# Patient Record
Sex: Male | Born: 1995 | Race: Black or African American | Hispanic: No | Marital: Single | Smoking: Never smoker
Health system: Southern US, Community
[De-identification: ages and names within clinical notes are randomized; demographics above are authoritative.]

---
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
## 2013-01-28 ENCOUNTER — Encounter (HOSPITAL_COMMUNITY): Payer: Self-pay | Admitting: Emergency Medicine

## 2013-01-28 ENCOUNTER — Emergency Department (HOSPITAL_COMMUNITY)
Admission: EM | Admit: 2013-01-28 | Discharge: 2013-01-28 | Disposition: A | Discharge status: Home / Self Care | Payer: Medicaid Other | Attending: Emergency Medicine | Admitting: Emergency Medicine

## 2013-01-28 ENCOUNTER — Emergency Department (HOSPITAL_COMMUNITY): Payer: Medicaid Other

## 2013-01-28 DIAGNOSIS — T07XXXA Unspecified multiple injuries, initial encounter: Secondary | ICD-10-CM | POA: Insufficient documentation

## 2013-01-28 DIAGNOSIS — S0100XA Unspecified open wound of scalp, initial encounter: Secondary | ICD-10-CM | POA: Insufficient documentation

## 2013-01-28 DIAGNOSIS — S0990XA Unspecified injury of head, initial encounter: Secondary | ICD-10-CM | POA: Insufficient documentation

## 2013-01-28 DIAGNOSIS — S0101XA Laceration without foreign body of scalp, initial encounter: Secondary | ICD-10-CM

## 2013-01-28 DIAGNOSIS — Z23 Encounter for immunization: Secondary | ICD-10-CM | POA: Insufficient documentation

## 2013-01-28 MED ORDER — IBUPROFEN 800 MG PO TABS: 800.0000 mg | ORAL_TABLET | Freq: Once | ORAL | Status: DC

## 2013-01-28 MED ORDER — HYDROCODONE-ACETAMINOPHEN 5-325 MG PO TABS: 2.0000 | ORAL_TABLET | ORAL | Status: DC | PRN

## 2013-01-28 MED ORDER — IBUPROFEN 800 MG PO TABS
800.0000 mg | ORAL_TABLET | Freq: Once | ORAL | Status: AC
  Administered 2013-01-28: 800 mg via ORAL
  Filled 2013-01-28: qty 1

## 2013-01-28 MED ORDER — TETANUS-DIPHTH-ACELL PERTUSSIS 5-2.5-18.5 LF-MCG/0.5 IM SUSP
0.5000 mL | Freq: Once | INTRAMUSCULAR | Status: AC
  Administered 2013-01-28: 0.5 mL via INTRAMUSCULAR
  Filled 2013-01-28: qty 0.5
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
## 2013-01-28 NOTE — ED Notes (Signed)
Patient found to have Laceration to posterior scalp, bleeding controlled at this time.

## 2013-01-28 NOTE — ED Notes (Signed)
Placed one ice pack on the posterior crest of patients head and one ice pack on his right side face/forehead.

## 2013-01-28 NOTE — ED Notes (Addendum)
MD at bedside. 

## 2013-01-31 NOTE — ED Provider Notes (Signed)
Medical screening examination/treatment/procedure(s) were performed by non-physician practitioner and as supervising physician I was immediately available for consultation/collaboration.     Brandt Loosen, MD 01/31/13 [phone]

## 2013-02-18 NOTE — ED Provider Notes (Signed)
Medical screening examination/treatment/procedure(s) were performed by non-physician practitioner and as supervising physician I was immediately available for consultation/collaboration.    Brandt Loosen, MD 02/18/13 [phone]

## 2013-12-03 ENCOUNTER — Emergency Department (HOSPITAL_COMMUNITY)
Admission: EM | Admit: 2013-12-03 | Discharge: 2013-12-03 | Disposition: A | Discharge status: Home / Self Care | Payer: BC Managed Care – PPO | Attending: Emergency Medicine | Admitting: Emergency Medicine

## 2013-12-03 ENCOUNTER — Encounter (HOSPITAL_COMMUNITY): Payer: Self-pay | Admitting: Emergency Medicine

## 2013-12-03 ENCOUNTER — Emergency Department (HOSPITAL_COMMUNITY): Payer: BC Managed Care – PPO

## 2013-12-03 DIAGNOSIS — S060X0A Concussion without loss of consciousness, initial encounter: Secondary | ICD-10-CM | POA: Diagnosis not present

## 2013-12-03 DIAGNOSIS — S0990XA Unspecified injury of head, initial encounter: Secondary | ICD-10-CM | POA: Insufficient documentation

## 2013-12-03 DIAGNOSIS — Y92410 Unspecified street and highway as the place of occurrence of the external cause: Secondary | ICD-10-CM | POA: Insufficient documentation

## 2013-12-03 DIAGNOSIS — T1490XA Injury, unspecified, initial encounter: Secondary | ICD-10-CM

## 2013-12-03 DIAGNOSIS — Y9389 Activity, other specified: Secondary | ICD-10-CM | POA: Insufficient documentation

## 2013-12-03 MED ORDER — IBUPROFEN 600 MG PO TABS: 600.0000 mg | ORAL_TABLET | Freq: Four times a day (QID) | ORAL | Status: DC | PRN

## 2013-12-03 MED ORDER — HYDROCODONE-ACETAMINOPHEN 5-325 MG PO TABS: 1.0000 | ORAL_TABLET | Freq: Four times a day (QID) | ORAL | Status: DC | PRN

## 2013-12-03 NOTE — ED Notes (Signed)
Pt discharged home with all belongings, pt alert, oriented and ambulatory upon discharge. 2 new RX prescribed, pt verbalizes understanding of discharge instructions, pt driven home by mother. Pt escorted to exit via wheel chair by [employer]
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
## 2013-12-03 NOTE — ED Notes (Signed)
Patient transported to CT 

## 2013-12-03 NOTE — ED Notes (Signed)
MD [place] at bedside.
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
## 2017-04-17 ENCOUNTER — Other Ambulatory Visit: Payer: Self-pay

## 2017-04-17 ENCOUNTER — Encounter (HOSPITAL_COMMUNITY): Payer: Self-pay | Admitting: *Deleted

## 2017-04-17 ENCOUNTER — Emergency Department (HOSPITAL_COMMUNITY)
Admission: EM | Admit: 2017-04-17 | Discharge: 2017-04-17 | Disposition: A | Discharge status: Home / Self Care | Payer: Self-pay | Attending: Emergency Medicine | Admitting: Emergency Medicine

## 2017-04-17 DIAGNOSIS — L299 Pruritus, unspecified: Secondary | ICD-10-CM | POA: Insufficient documentation

## 2017-04-17 LAB — URINALYSIS, ROUTINE W REFLEX MICROSCOPIC
Bilirubin Urine: NEGATIVE
GLUCOSE, UA: NEGATIVE mg/dL
Hgb urine dipstick: NEGATIVE
Ketones, ur: NEGATIVE mg/dL
LEUKOCYTES UA: NEGATIVE
Nitrite: NEGATIVE
PH: 7 (ref 5.0–8.0)
PROTEIN: NEGATIVE mg/dL
Specific Gravity, Urine: 1.02 (ref 1.005–1.030)

## 2017-04-17 LAB — CBC WITH DIFFERENTIAL/PLATELET
Basophils Absolute: 0 10*3/uL (ref 0.0–0.1)
Basophils Relative: 0 %
EOS ABS: 0.1 10*3/uL (ref 0.0–0.7)
EOS PCT: 1 %
HCT: 43.2 % (ref 39.0–52.0)
Hemoglobin: 14.4 g/dL (ref 13.0–17.0)
Lymphocytes Relative: 25 %
Lymphs Abs: 1.7 10*3/uL (ref 0.7–4.0)
MCH: 31 pg (ref 26.0–34.0)
MCHC: 33.3 g/dL (ref 30.0–36.0)
MCV: 93.1 fL (ref 78.0–100.0)
MONO ABS: 0.3 10*3/uL (ref 0.1–1.0)
MONOS PCT: 5 %
Neutro Abs: 4.7 10*3/uL (ref 1.7–7.7)
Neutrophils Relative %: 69 %
PLATELETS: 361 10*3/uL (ref 150–400)
RBC: 4.64 MIL/uL (ref 4.22–5.81)
RDW: 11.9 % (ref 11.5–15.5)
WBC: 6.7 10*3/uL (ref 4.0–10.5)

## 2017-04-17 LAB — COMPREHENSIVE METABOLIC PANEL
ALT: 19 U/L (ref 17–63)
AST: 28 U/L (ref 15–41)
Albumin: 4.5 g/dL (ref 3.5–5.0)
Alkaline Phosphatase: 72 U/L (ref 38–126)
Anion gap: 6 (ref 5–15)
BUN: 11 mg/dL (ref 6–20)
CO2: 27 mmol/L (ref 22–32)
CREATININE: 0.99 mg/dL (ref 0.61–1.24)
Calcium: 9 mg/dL (ref 8.9–10.3)
Chloride: 104 mmol/L (ref 101–111)
GFR calc Af Amer: >60 mL/min (ref >60)
GLUCOSE: 93 mg/dL (ref 65–99)
Potassium: 3.9 mmol/L (ref 3.5–5.1)
Sodium: 137 mmol/L (ref 135–145)
Total Bilirubin: 0.6 mg/dL (ref 0.3–1.2)
Total Protein: 7.8 g/dL (ref 6.5–8.1)

## 2017-04-17 NOTE — ED Notes (Signed)
Bed: WTR8 Expected date:  Expected time:  Means of arrival:  Comments: 

## 2017-04-17 NOTE — ED Triage Notes (Addendum)
Pt has had dry itchy skin without rash for about a month, just tired of it today and decided to come be checked.MOTHER IS WITH PT AND IS STATING THAT PT HAS WHEEZING AND BREATHING HARD, PT DENIES IT OR ANY SYMPTOMS ASSOCIATED WITH IT. MOTHER WANTS "INTERNAL TEST" DONE TO FINE OUT WHY HE IS ITCHING. MOTHER WALKS OUT OF ROOM STATING "HOSPITALS ARE GETTING SORRIER AND SORRIER."

## 2017-04-17 NOTE — Discharge Instructions (Signed)
There were no abnormalities noted on the lab work.  This includes no abnormalities in blood sugar levels, kidney function, liver function, or blood counts. Itching on the skin is commonly from a product, something you are eating, or something to which you have been repeated exposed, even if you have not had issues with that substance before.  One of the best ways to find what it could be is to keep a journal of everything you put on your skin or put in your body, and then take away one thing at a time for a week or two. This will also help a specialist should you need to see one.   Treatment with hydrocortisone cream or ointment may help.  You may also simply try moisturizing the skin with thicker products, such as Eucerin lotion or moisturizing ointments.  Please follow-up with a primary care provider, dermatologist, or allergy specialist on this matter.   Follow up with a

## 2017-04-17 NOTE — ED Provider Notes (Signed)
[facility]-EMERGENCY DEPT Provider Note   CSN: [id number] Arrival date & time: 04/17/17  1030     History   Chief Complaint Chief Complaint  Patient presents with  . Pruritis    HPI [place] Kuang is a 22 y.o. male.  HPI   Seth Le is a 22 y.o. 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  This sensation waxes and wanes in intensity, however, patient has not noticed a pattern.  He states it does not seem to be associated with eating, use of alcohol, exercise, or any other specific event. States he is physically fit, works out every day, which is not new for him.  He does use a workout supplement that contains protein, creatine, and caffeine, however, he has been using this for a couple months.  He does use alcohol, but only on the weekends.  Denies illicit drug use or tobacco use.  No changes to diet, skin products, soaps, or detergents.  Patient takes no daily medications. Patient adds, "I was reading on the Internet and was afraid my itching was caused by a kidney problem so I have been drinking a lot of water." Patient is accompanied by his mother.  Mother states she has noticed the patient has been intermittently "breathing heavy" while at rest for the past several months.  Patient states he has not noticed and does not feel abnormal when his mother brings his breathing to his attention. Denies rash, numbness, weakness, fatigue, color change, wounds, polyuria, hematuria, dysuria, chest pain, shortness of breath, N/V/D, abdominal or flank pain, palpitations, extremity swelling, night sweats, or any other complaints.   History reviewed. No pertinent past medical history.  There are no active problems to display for this patient.   History reviewed. No pertinent surgical history.     Home Medications    Prior to Admission medications   Medication Sig Start Date End Date  Taking? Authorizing Provider  HYDROcodone-acetaminophen (NORCO/VICODIN) 5-325 MG per tablet Take 1 tablet by mouth every 6 (six) hours as needed. 12/03/13   Derwood Kaplan, MD  ibuprofen (ADVIL,MOTRIN) 600 MG tablet Take 1 tablet (600 mg total) by mouth every 6 (six) hours as needed. 12/03/13   Derwood Kaplan, MD    Family History No family history on file.  Social History Social History   Tobacco Use  . Smoking status: Never Smoker  . Smokeless tobacco: Never Used  Substance Use Topics  . Alcohol use: Yes  . Drug use: No     Allergies   Patient has no known allergies.   Review of Systems Review of Systems  Constitutional: Negative for chills, diaphoresis, fatigue and fever.  Respiratory: Negative for cough and shortness of breath.   Cardiovascular: Negative for chest pain and leg swelling.  Gastrointestinal: Negative for abdominal pain, blood in stool, diarrhea, nausea and vomiting.  Genitourinary: Negative for difficulty urinating, discharge, dysuria, flank pain and hematuria.  Musculoskeletal: Negative for back pain.  Skin: Negative for color change and rash.       Pruritus  Neurological: Negative for dizziness, weakness, light-headedness, numbness and headaches.  All other systems reviewed and are negative.    Physical Exam Updated Vital Signs BP 137/70 (BP Location: Right Arm)   Pulse 62   Temp 98.1 F (36.7 C) (Oral)   Resp 18   Ht 5\' 9"  (1.753 m)   Wt 74.8 kg (165 lb)   SpO2  100%   BMI 24.37 kg/m   Physical Exam  Constitutional: He appears well-developed and well-nourished. No distress.  HENT:  Head: Normocephalic and atraumatic.  Eyes: Conjunctivae and EOM are normal. Pupils are equal, round, and reactive to light.  Neck: Neck supple.  Cardiovascular: Normal rate, regular rhythm, normal heart sounds and intact distal pulses.  Pulmonary/Chest: Effort normal and breath sounds normal. No respiratory distress.  Abdominal: Soft. There is no tenderness.  There is no guarding.  Musculoskeletal: He exhibits no edema or tenderness.  Normal motor function intact in all extremities and spine. No midline spinal tenderness.   Lymphadenopathy:    He has no cervical adenopathy.  Neurological: He is alert.  No sensory deficits. Strength 5/5 in all extremities. No gait disturbance. Coordination intact including heel to shin and finger to nose. Cranial nerves III-XII grossly intact. No facial droop.   Skin: Skin is warm and dry. Capillary refill takes less than 2 seconds. No rash noted. He is not diaphoretic. No erythema. No pallor.  Generalized skin exam performed of the face, neck, chest, back, and extremities.  No rash or lesions noted.  No excoriations or evidence of dry skin noted.  Psychiatric: He has a normal mood and affect. His behavior is normal.  Nursing note and vitals reviewed.    ED Treatments / Results  Labs (all labs ordered are listed, but only abnormal results are displayed) Labs Reviewed  URINALYSIS, ROUTINE W REFLEX MICROSCOPIC  COMPREHENSIVE METABOLIC PANEL  CBC WITH DIFFERENTIAL/PLATELET    EKG  EKG Interpretation None       Radiology No results found.  Procedures Procedures (including critical care time)  Medications Ordered in ED Medications - No data to display   Initial Impression / Assessment and Plan / ED Course  I have reviewed the triage vital signs and the nursing notes.  Pertinent labs & imaging results that were available during my care of the patient were reviewed by me and considered in my medical decision making (see chart for details).      Patient presents with pruritus for at least the past month.  No noted physical abnormalities on exam.  No abnormalities noted on lab work.  PCP, allergist, or dermatology follow-up.  Resources given.  Strategies to help with the itching were also discussed.   Final Clinical Impressions(s) / ED Diagnoses   Final diagnoses:  Itching    ED Discharge  Orders    None       Anselm PancoastJoy, Cordale Manera C, PA-C 04/17/17 1452    Concepcion LivingJoy, Rondo Spittler C, PA-C 04/17/17 1530    Lorre NickAllen, Anthony, MD 04/19/17 1110

## 2019-05-03 ENCOUNTER — Ambulatory Visit (INDEPENDENT_AMBULATORY_CARE_PROVIDER_SITE_OTHER): Payer: BC Managed Care – PPO

## 2019-05-03 ENCOUNTER — Ambulatory Visit
Admission: EM | Admit: 2019-05-03 | Discharge: 2019-05-03 | Disposition: A | Discharge status: Home / Self Care | Payer: BC Managed Care – PPO | Attending: Emergency Medicine | Admitting: Emergency Medicine

## 2019-05-03 ENCOUNTER — Other Ambulatory Visit: Payer: Self-pay

## 2019-05-03 ENCOUNTER — Encounter: Payer: Self-pay | Admitting: Emergency Medicine

## 2019-05-03 DIAGNOSIS — S93491A Sprain of other ligament of right ankle, initial encounter: Secondary | ICD-10-CM | POA: Diagnosis not present

## 2019-05-03 DIAGNOSIS — M25571 Pain in right ankle and joints of right foot: Secondary | ICD-10-CM

## 2019-05-03 DIAGNOSIS — X501XXA Overexertion from prolonged static or awkward postures, initial encounter: Secondary | ICD-10-CM | POA: Diagnosis not present

## 2019-05-03 MED ORDER — NAPROXEN 500 MG PO TABS: 500.0000 mg | ORAL_TABLET | Freq: Two times a day (BID) | ORAL | 0 refills | Status: AC

## 2019-05-03 NOTE — ED Notes (Signed)
Patient able to ambulate independently  

## 2019-05-03 NOTE — Discharge Instructions (Addendum)
Recommend RICE: rest, ice, compression, elevation as needed for pain.   Cold therapy (ice packs) can be used to help swelling both after injury and after prolonged use of areas of chronic pain/aches.  For pain: Take naproxen as directed. Do not take other NSAIDs such as BC powders, Goody powders, ibuprofen, Motrin, Advil

## 2019-05-03 NOTE — ED Provider Notes (Signed)
[facility]    CSN: [id number] Arrival date & time: 05/03/19  1044      History   Chief Complaint Chief Complaint  Patient presents with  . Ankle Pain    HPI Seth Le is a 24 y.o. male without significant medical history presenting for right ankle pain, swelling, difficulty walking after inversion ankle injury yesterday evening around 8 PM.  States he was jumping for rebound, landed on his friend's foot and rolled his ankle.  Denies head trauma, LOC.  States he heard a pop with some subsequent numbness in his foot.  Denies numbness currently, able to move all toes.  Has been using his older brothers cam walker to help him ambulate, though still dresses pain with doing so.  Has not take anything for pain.   History reviewed. No pertinent past medical history.  There are no problems to display for this patient.   History reviewed. No pertinent surgical history.     Home Medications    Prior to Admission medications   Medication Sig Start Date End Date Taking? Authorizing Provider  naproxen (NAPROSYN) 500 MG tablet Take 1 tablet (500 mg total) by mouth 2 (two) times daily. 05/03/19   Hall-Potvin, [place], PA-C    Family History Family History  Problem Relation Age of Onset  . Diabetes Mother     Social History Social History   Tobacco Use  . Smoking status: Never Smoker  . Smokeless tobacco: Never Used  Substance Use Topics  . Alcohol use: Yes  . Drug use: No     Allergies   Patient has no known allergies.   Review of Systems As per HPI   Physical Exam Triage Vital Signs ED Triage Vitals [05/03/19 1051]  Enc Vitals Group     BP 136/80     Pulse Rate 76     Resp 16     Temp 97.9 F (36.6 C)     Temp Source Temporal     SpO2 98 %     Weight      Height      Head Circumference      Peak Flow      Pain Score 9     Pain Loc      Pain Edu?      Excl. in [place]?    No data found.  Updated Vital Signs BP 136/80 (BP Location: Left  Arm)   Pulse 76   Temp 97.9 F (36.6 C) (Temporal)   Resp 16   SpO2 98%   Visual Acuity Right Eye Distance:   Left Eye Distance:   Bilateral Distance:    Right Eye Near:   Left Eye Near:    Bilateral Near:     Physical Exam Constitutional:      General: He is not in acute distress. HENT:     Head: Normocephalic and atraumatic.  Eyes:     General: No scleral icterus.    Pupils: Pupils are equal, round, and reactive to light.  Cardiovascular:     Rate and Rhythm: Normal rate.  Pulmonary:     Effort: Pulmonary effort is normal. No respiratory distress.     Breath sounds: No wheezing.  Musculoskeletal:     Comments: Patient with significant lateral malleoli swelling with moderate TTP of right ankle.  No ecchymosis, erythema, warmth.  Decreased ROM of right ankle.  Strength deferred.  DP pulses 2+ bilaterally.  Sensation intact.  Skin:    Coloration: Skin is  not jaundiced or pale.  Neurological:     Mental Status: He is alert and oriented to person, place, and time.      UC Treatments / Results  Labs (all labs ordered are listed, but only abnormal results are displayed) Labs Reviewed - No data to display  EKG   Radiology DG Ankle Complete Right  Result Date: 05/03/2019 CLINICAL DATA:  24 year old who fell and injured the RIGHT ankle yesterday. Persistent pain and swelling. Initial encounter. EXAM: RIGHT ANKLE - COMPLETE 3+ VIEW COMPARISON:  None. FINDINGS: Lateral and dorsal soft tissue swelling. No evidence of acute fracture. Tibiotalar joint anatomically aligned with well-preserved joint space. Well-preserved bone mineral density. No intrinsic osseous abnormalities. Small joint effusion. IMPRESSION: No osseous abnormality. Small joint effusion. Electronically Signed   By: Hulan Saas M.D.   On: 05/03/2019 11:15    Procedures Procedures (including critical care time)  Medications Ordered in UC Medications - No data to display  Initial Impression /  Assessment and Plan / UC Course  I have reviewed the triage vital signs and the nursing notes.  Pertinent labs & imaging results that were available during my care of the patient were reviewed by me and considered in my medical decision making (see chart for details).     Patient with significant swelling, TTP after inversion injury.  X-ray of right ankle done in office, reviewed by me radiology: As it of for dorsal lateral soft tissue swelling with small joint effusion.  Negative for widened joint spaces, fracture or dislocation.  Reviewed findings with patient verbalized understanding.  Patient given ASO brace in office which he tolerated well.  Declines crutches.  Discussed may weight-bear as tolerated, RICE, and follow-up with Ortho/sports med in 1 week for persistent, worsening symptoms.  Return precautions discussed, patient verbalized understanding and is agreeable to plan. Final Clinical Impressions(s) / UC Diagnoses   Final diagnoses:  Sprain of anterior talofibular ligament of right ankle, initial encounter     Discharge Instructions     Recommend RICE: rest, ice, compression, elevation as needed for pain.   Cold therapy (ice packs) can be used to help swelling both after injury and after prolonged use of areas of chronic pain/aches.  For pain: Take naproxen as directed. Do not take other NSAIDs such as BC powders, Goody powders, ibuprofen, Motrin, Advil     ED Prescriptions    Medication Sig Dispense Auth. Provider   naproxen (NAPROSYN) 500 MG tablet Take 1 tablet (500 mg total) by mouth 2 (two) times daily. 30 tablet Hall-Potvin, [place], PA-C     PDMP not reviewed this encounter.   Hall-Potvin, [place], [place] 05/03/19 1304

## 2019-05-03 NOTE — ED Triage Notes (Signed)
Pt presents to [facility] for assessment after jumping up for a rebound in basketball yesterday and coming down and landing on his buddy's foot and twisting his ankle.  States he heard a pop sound, and the foot went numb for a few minutes.  Patient presents wearing brother's cam walker to right ankle.  Patient concerned for fracture.  Pt c/o medial ankle pain with ambulation and movement.

## 2022-01-15 IMAGING — DX DG ANKLE COMPLETE 3+V*R*
3 series · 3 of 3 positions shown · non-contrast
Comparison: None.

CLINICAL DATA: 23-year-old who fell and injured the RIGHT ankle
yesterday. Persistent pain and swelling. Initial encounter.

EXAM:
RIGHT ANKLE - COMPLETE 3+ VIEW

[ankle ap]
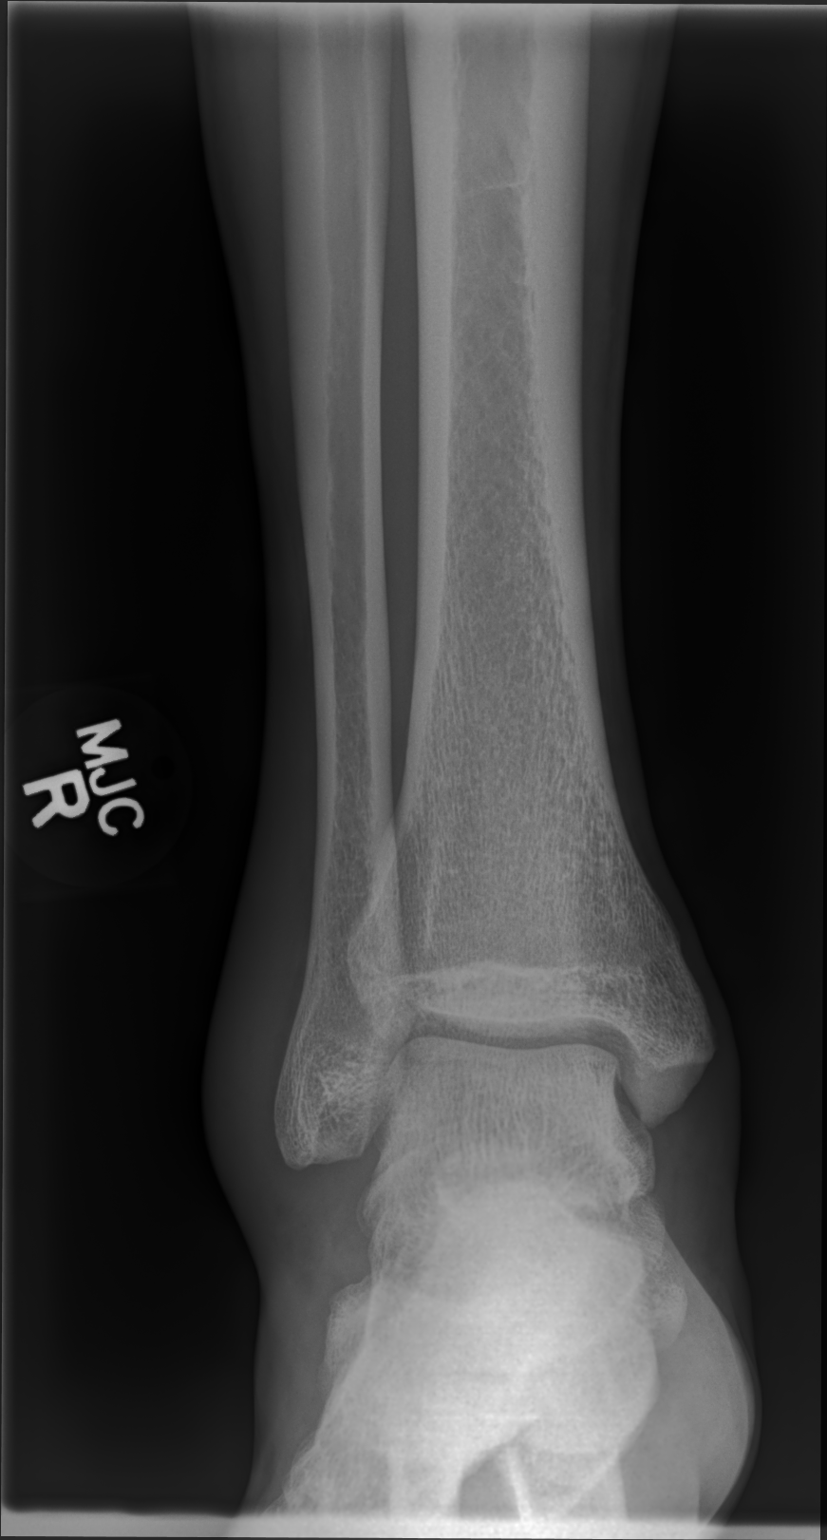

[ankle medial oblique]
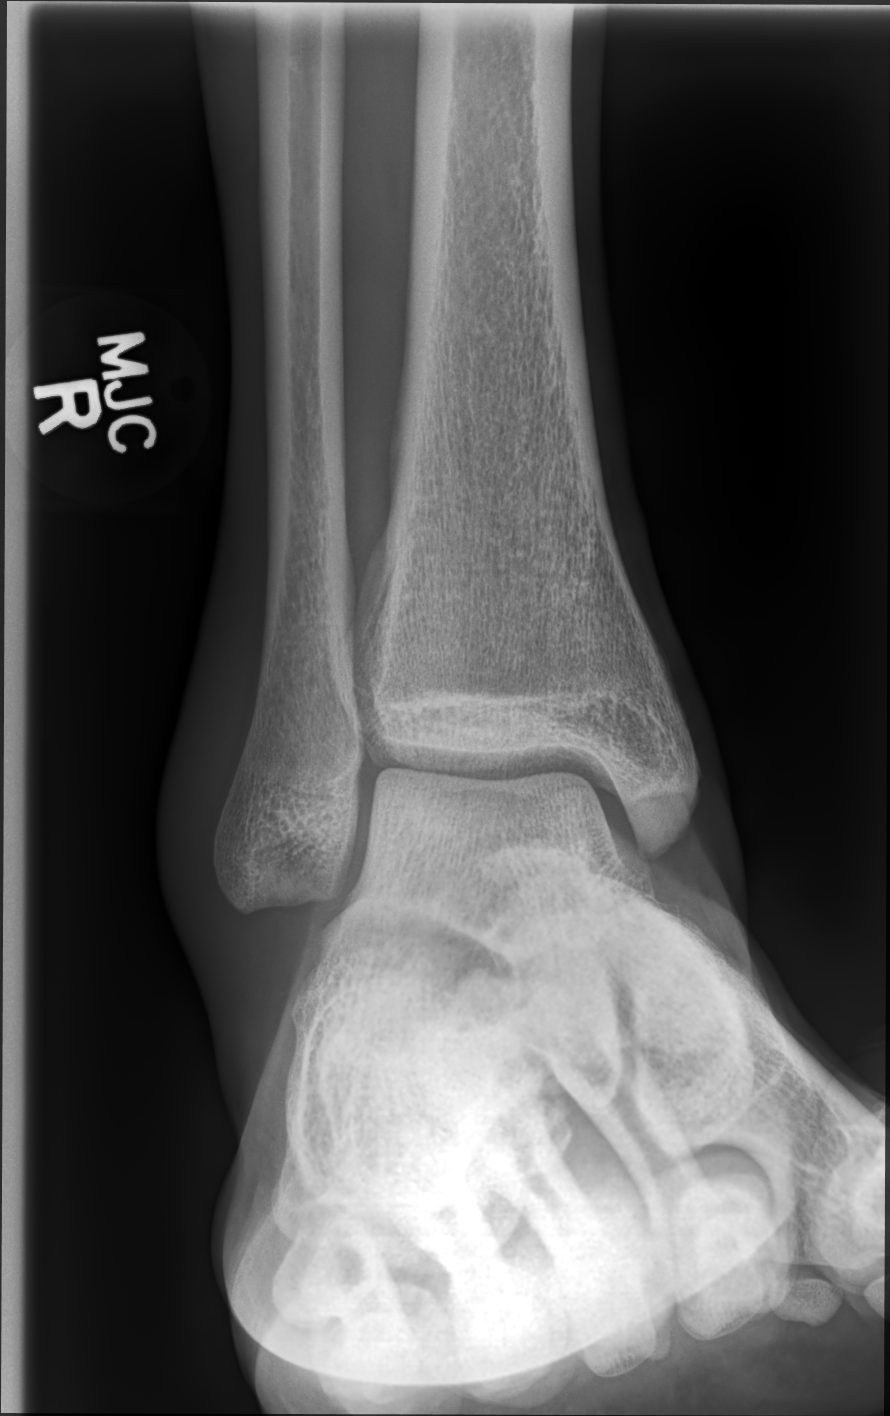

[ankle lat]
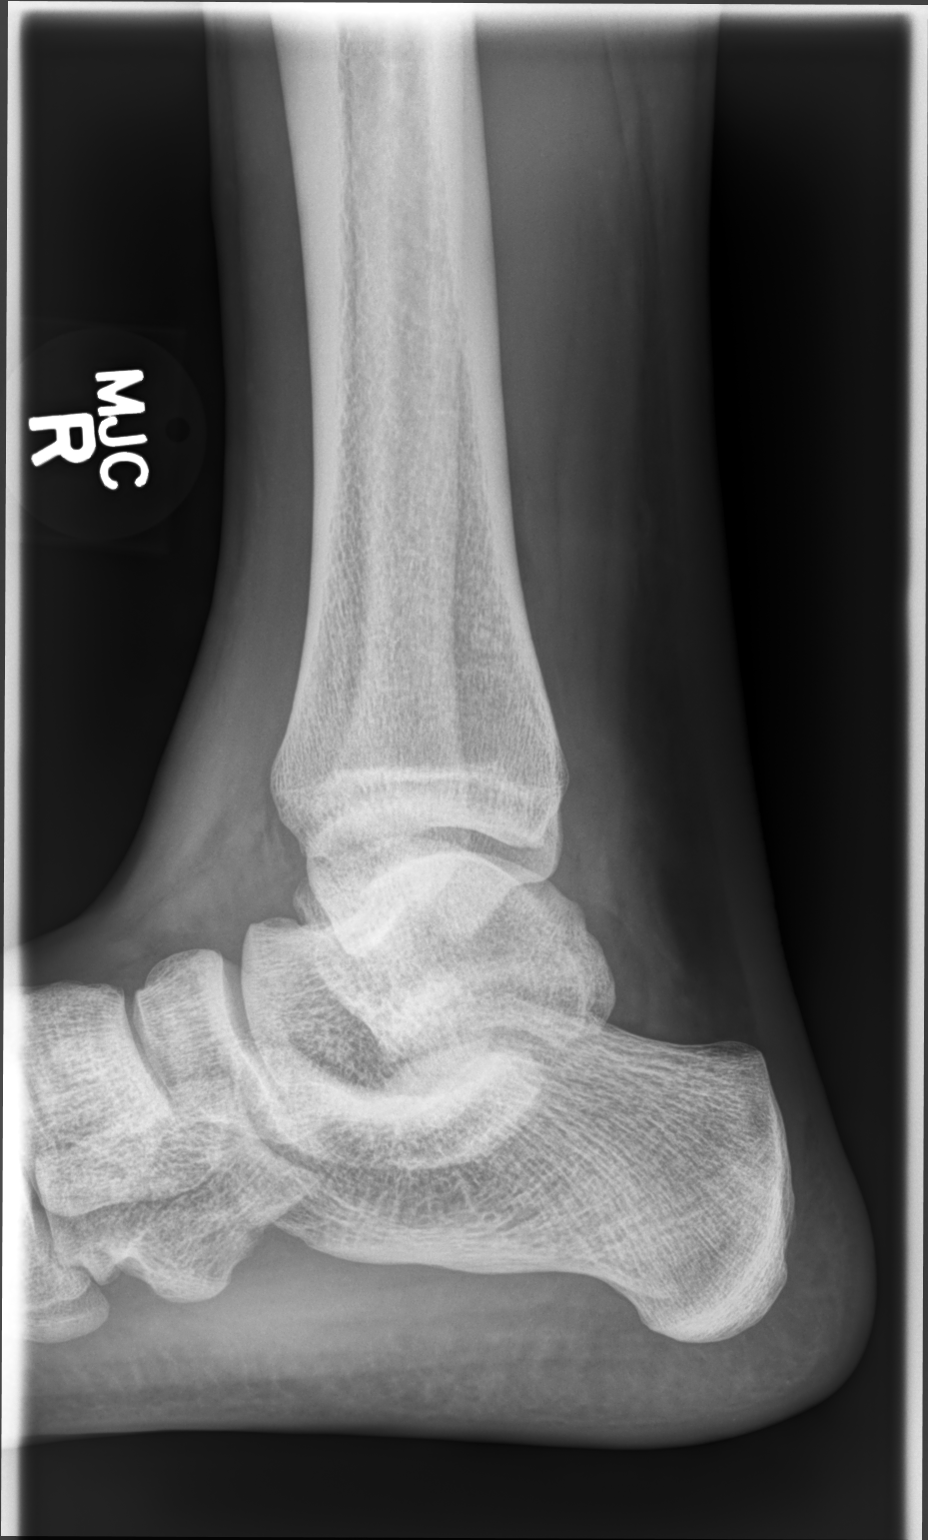

[3 of 3 positions shown; findings below may reference images not displayed]

FINDINGS: Lateral and dorsal soft tissue swelling. No evidence of acute
fracture. Tibiotalar joint anatomically aligned with well-preserved
joint space. Well-preserved bone mineral density. No intrinsic
osseous abnormalities. Small joint effusion.
IMPRESSION: No osseous abnormality. Small joint effusion.
